# Patient Record
Sex: Male | Born: 1995 | Race: White | Hispanic: No | Marital: Single | Smoking: Never smoker
Health system: Southern US, Community
[De-identification: ages and names within clinical notes are randomized; demographics above are authoritative.]

---
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
## 2016-12-03 ENCOUNTER — Encounter: Payer: Self-pay | Admitting: Emergency Medicine

## 2016-12-03 ENCOUNTER — Emergency Department
Admission: EM | Admit: 2016-12-03 | Discharge: 2016-12-03 | Disposition: A | Discharge status: Home / Self Care | Payer: Worker's Compensation | Attending: Emergency Medicine | Admitting: Emergency Medicine

## 2016-12-03 DIAGNOSIS — S61411A Laceration without foreign body of right hand, initial encounter: Secondary | ICD-10-CM

## 2016-12-03 DIAGNOSIS — W268XXA Contact with other sharp object(s), not elsewhere classified, initial encounter: Secondary | ICD-10-CM | POA: Diagnosis not present

## 2016-12-03 DIAGNOSIS — S61011A Laceration without foreign body of right thumb without damage to nail, initial encounter: Secondary | ICD-10-CM | POA: Insufficient documentation

## 2016-12-03 DIAGNOSIS — Z23 Encounter for immunization: Secondary | ICD-10-CM | POA: Diagnosis not present

## 2016-12-03 DIAGNOSIS — Y990 Civilian activity done for income or pay: Secondary | ICD-10-CM | POA: Diagnosis not present

## 2016-12-03 DIAGNOSIS — Y9389 Activity, other specified: Secondary | ICD-10-CM | POA: Diagnosis not present

## 2016-12-03 DIAGNOSIS — Y929 Unspecified place or not applicable: Secondary | ICD-10-CM | POA: Diagnosis not present

## 2016-12-03 MED ORDER — LIDOCAINE HCL (PF) 1 % IJ SOLN
INTRAMUSCULAR | Status: AC
  Filled 2016-12-03: qty 5

## 2016-12-03 MED ORDER — TETANUS-DIPHTH-ACELL PERTUSSIS 5-2.5-18.5 LF-MCG/0.5 IM SUSP
INTRAMUSCULAR | Status: AC
Start: 2016-12-03 — End: 2016-12-03
  Administered 2016-12-03: 0.5 mL via INTRAMUSCULAR
  Filled 2016-12-03: qty 0.5

## 2016-12-03 MED ORDER — LIDOCAINE HCL (PF) 1 % IJ SOLN
5.0000 mL | Freq: Once | INTRAMUSCULAR | Status: DC
  Filled 2016-12-03: qty 5

## 2016-12-03 MED ORDER — TETANUS-DIPHTH-ACELL PERTUSSIS 5-2.5-18.5 LF-MCG/0.5 IM SUSP
0.5000 mL | Freq: Once | INTRAMUSCULAR | Status: AC
  Administered 2016-12-03: 0.5 mL via INTRAMUSCULAR

## 2016-12-03 MED ORDER — BACITRACIN ZINC 500 UNIT/GM EX OINT
1.0000 "application " | TOPICAL_OINTMENT | Freq: Two times a day (BID) | CUTANEOUS | Status: DC
  Administered 2016-12-03: 1 via TOPICAL
  Filled 2016-12-03: qty 0.9

## 2016-12-03 NOTE — ED Notes (Addendum)
Attempting to call Darvin Cornett [phone] no answer.  Per Timoteo AceWilliam Riley, supervisor, states for patient to have workers comp UDS completed. [facility] paperwork used to complete UDS by Waynetta SandyBeth, EDT.

## 2016-12-03 NOTE — ED Provider Notes (Signed)
[facility] Emergency Department Provider Note  ____________________________________________  Time seen: Approximately 9:42 PM  I have reviewed the triage vital signs and the nursing notes.   HISTORY  Chief Complaint Laceration   HPI Gerald Lopez is a 21 y.o. male who presents to the emergency department for evaluation of laceration to his right hand that was cut on a fire hose at work this evening. Tetanus status questionable.   History reviewed. No pertinent past medical history.  There are no active problems to display for this patient.   History reviewed. No pertinent surgical history.  Prior to Admission medications   Not on File    Allergies Patient has no known allergies.  No family history on file.  Social History Social History  Substance Use Topics  . Smoking status: Not on file  . Smokeless tobacco: Not on file  . Alcohol use Not on file    Review of Systems  Constitutional: Well appearing. Musculoskeletal: Positive for pain. Skin: Positive for laceration Neurological: Negative for focal weakness or numbness. ____________________________________________   PHYSICAL EXAM:  VITAL SIGNS: ED Triage Vitals  Enc Vitals Group     BP 12/03/16 2126 (!) 153/94     Pulse Rate 12/03/16 2126 70     Resp 12/03/16 2126 18     Temp 12/03/16 2126 98.7 F (37.1 C)     Temp Source 12/03/16 2126 Oral     SpO2 12/03/16 2126 99 %     Weight 12/03/16 2126 165 lb (74.8 kg)     Height 12/03/16 2126 5\' 11"  (1.803 m)     Head Circumference --      Peak Flow --      Pain Score 12/03/16 2135 1     Pain Loc --      Pain Edu? --      Excl. in GC? --      Constitutional: Alert and oriented. Well appearing and in no acute distress. Eyes: Conjunctivae are normal. EOMI. Nose: No congestion/rhinnorhea. Mouth/Throat: Mucous membranes are moist.   Neck: No stridor. Cardiovascular: Good peripheral circulation. Respiratory: Normal  respiratory effort.  No retractions. Musculoskeletal: FROM throughout. Neurologic:  Normal speech and language. No gross focal neurologic deficits are appreciated. Skin:  2cm laceration to the skin fold of base of the right thumb.  ____________________________________________   LABS (all labs ordered are listed, but only abnormal results are displayed)  Labs Reviewed - No data to display ____________________________________________  EKG   ____________________________________________  RADIOLOGY  Not indicated. ____________________________________________   PROCEDURES  Procedure(s) performed:  LACERATION REPAIR Performed by: Kem Boroughsari Essence Merle  Consent: Verbal consent obtained.  Consent given by: patient  Prepped and Draped in normal sterile fashion  Wound explored: no foreign bodies identified  Laceration Location: base of right thumb  Laceration Length: 2 cm  Anesthesia: local  Local anesthetic: lidocaine 1% without epinephrine  Anesthetic total: 3 ml  Irrigation method: syringe  Amount of cleaning: standard  Skin closure: 5-0 nylon  Number of sutures: 4  Technique: simple interrupted.  Patient tolerance: Patient tolerated the procedure well with no immediate complications.    ____________________________________________   INITIAL IMPRESSION / ASSESSMENT AND PLAN / ED COURSE     Pertinent labs & imaging results that were available during my care of the patient were reviewed by me and considered in my medical decision making (see chart for details).  21 year old male presenting to the emergency department for laceration repair. He was given wound  care instructions and advised to follow up with the PCP or return to the ER for symptoms of concern. He was advised to have the sutures removed in 10 days. ____________________________________________   FINAL CLINICAL IMPRESSION(S) / ED DIAGNOSES  Final diagnoses:  Laceration of right hand without  foreign body, initial encounter    There are no discharge medications for this patient.   Note:  This document was prepared using Dragon voice recognition software and may include unintentional dictation errors.    Chinita Pester, FNP 12/04/16 1603    Myrna Blazer, MD 12/06/16 [phone]

## 2016-12-03 NOTE — ED Triage Notes (Signed)
Pt presents to ED with c/o laceration to RIGHT hand from fireman hose. Hand bandaged.

## 2016-12-03 NOTE — ED Notes (Signed)
Lac to right hand, wrapped, bleeding controlled at this time.

## 2016-12-03 NOTE — ED Notes (Signed)
Pt. Verbalizes understanding of d/c instructions and follow-up. VS stable and pain controlled per pt.  Pt. In NAD at time of d/c and denies further concerns regarding this visit. Pt. Stable at the time of departure from the unit, departing unit by the safest and most appropriate manner per that pt condition and limitations. Pt advised to return to the ED at any time for emergent concerns, or for new/worsening symptoms.   

## 2016-12-03 NOTE — Discharge Instructions (Signed)
Do not get the sutured area wet for 24 hours. After 24 hours, shower/bathe as usual and pat the area dry. Change the bandage 2 times per day and apply antibiotic ointment. Leave open to air when at no risk of getting the area dirty, but cover at night before bed.   

## 2016-12-13 ENCOUNTER — Emergency Department
Admission: EM | Admit: 2016-12-13 | Discharge: 2016-12-13 | Disposition: A | Discharge status: Home / Self Care | Payer: Worker's Compensation | Attending: Emergency Medicine | Admitting: Emergency Medicine

## 2016-12-13 ENCOUNTER — Encounter: Payer: Self-pay | Admitting: Emergency Medicine

## 2016-12-13 DIAGNOSIS — Z4802 Encounter for removal of sutures: Secondary | ICD-10-CM

## 2016-12-13 NOTE — ED Triage Notes (Signed)
Patient presents to the ED for suture removal.  Denies issues.

## 2016-12-13 NOTE — ED Provider Notes (Signed)
[facility] Emergency Department Provider Note   ____________________________________________   First MD Initiated Contact with Patient 12/13/16 1344     (approximate)  I have reviewed the triage vital signs and the nursing notes.   HISTORY  Chief Complaint Suture / Staple Removal    HPI Gerald Lopez is a 21 y.o. male patient here for suture removal from left thenar fossa. Sutures placed 10 days ago by this facility. Patient denies any pain or signs and symptoms of infection.  History reviewed. No pertinent past medical history.  There are no active problems to display for this patient.   History reviewed. No pertinent surgical history.  Prior to Admission medications   Not on File    Allergies Patient has no known allergies.  No family history on file.  Social History Social History  Substance Use Topics  . Smoking status: Never Smoker  . Smokeless tobacco: Never Used  . Alcohol use No    Review of Systems Constitutional: No fever/chills Eyes: No visual changes. ENT: No sore throat. Cardiovascular: Denies chest pain. Respiratory: Denies shortness of breath. Gastrointestinal: No abdominal pain.  No nausea, no vomiting.  No diarrhea.  No constipation. Genitourinary: Negative for dysuria. Musculoskeletal: Negative for back pain. Skin: Negative for rash. Laceration left hand Neurological: Negative for headaches, focal weakness or numbness.    ____________________________________________   PHYSICAL EXAM:  VITAL SIGNS: ED Triage Vitals  Enc Vitals Group     BP 12/13/16 1254 (!) 159/84     Pulse Rate 12/13/16 1254 93     Resp 12/13/16 1254 18     Temp 12/13/16 1254 98.5 F (36.9 C)     Temp Source 12/13/16 1254 Oral     SpO2 12/13/16 1254 100 %     Weight 12/13/16 1251 160 lb (72.6 kg)     Height 12/13/16 1251 5\' 11"  (1.803 m)     Head Circumference --      Peak Flow --      Pain Score 12/13/16 1251 0     Pain Loc --       Pain Edu? --      Excl. in GC? --     Constitutional: Alert and oriented. Well appearing and in no acute distress. Eyes: Conjunctivae are normal. PERRL. EOMI. Head: Atraumatic. Nose: No congestion/rhinnorhea. Mouth/Throat: Mucous membranes are moist.  Oropharynx non-erythematous. Neck: No stridor.  No cervical spine tenderness to palpation. Hematological/Lymphatic/Immunilogical: No cervical lymphadenopathy. Cardiovascular: Normal rate, regular rhythm. Grossly normal heart sounds.  Good peripheral circulation. Respiratory: Normal respiratory effort.  No retractions. Lungs CTAB. Gastrointestinal: Soft and nontender. No distention. No abdominal bruits. No CVA tenderness. Musculoskeletal: No lower extremity tenderness nor edema.  No joint effusions. Neurologic:  Normal speech and language. No gross focal neurologic deficits are appreciated. No gait instability. Skin:  Skin is warm, dry and intact. No rash noted. Psychiatric: Mood and affect are normal. Speech and behavior are normal.  ____________________________________________   LABS (all labs ordered are listed, but only abnormal results are displayed)  Labs Reviewed - No data to display ____________________________________________  EKG   ____________________________________________  RADIOLOGY   ____________________________________________   PROCEDURES  Procedure(s) performed: None  Procedures  Critical Care performed: No  ____________________________________________   INITIAL IMPRESSION / ASSESSMENT AND PLAN / ED COURSE  Pertinent labs & imaging results that were available during my care of the patient were reviewed by me and considered in my medical decision making (see chart for details).  Removal of 4 simple sutures of left  Thenar fossa. There he was bandaged and patient given discharge care instructions. Advised to follow-up condition worsens.     ____________________________________________   FINAL CLINICAL IMPRESSION(S) / ED DIAGNOSES  Final diagnoses:  Encounter for removal of sutures      NEW MEDICATIONS STARTED DURING THIS VISIT:  New Prescriptions   No medications on file     Note:  This document was prepared using Dragon voice recognition software and may include unintentional dictation errors.    Joni Reining, PA-C 12/13/16 1350    Charlynne Pander, MD 12/13/16 1539

## 2019-02-09 DIAGNOSIS — R221 Localized swelling, mass and lump, neck: Secondary | ICD-10-CM | POA: Diagnosis not present

## 2019-02-09 DIAGNOSIS — E663 Overweight: Secondary | ICD-10-CM | POA: Diagnosis not present

## 2019-03-09 DIAGNOSIS — R221 Localized swelling, mass and lump, neck: Secondary | ICD-10-CM | POA: Diagnosis not present

## 2019-07-02 DIAGNOSIS — M25539 Pain in unspecified wrist: Secondary | ICD-10-CM | POA: Diagnosis not present

## 2019-08-21 DIAGNOSIS — Z20828 Contact with and (suspected) exposure to other viral communicable diseases: Secondary | ICD-10-CM | POA: Diagnosis not present

## 2019-11-26 DIAGNOSIS — F458 Other somatoform disorders: Secondary | ICD-10-CM | POA: Diagnosis not present

## 2019-11-26 DIAGNOSIS — K219 Gastro-esophageal reflux disease without esophagitis: Secondary | ICD-10-CM | POA: Diagnosis not present

## 2019-11-27 DIAGNOSIS — Z136 Encounter for screening for cardiovascular disorders: Secondary | ICD-10-CM | POA: Diagnosis not present

## 2019-12-05 DIAGNOSIS — Z139 Encounter for screening, unspecified: Secondary | ICD-10-CM | POA: Diagnosis not present

## 2020-01-07 DIAGNOSIS — J301 Allergic rhinitis due to pollen: Secondary | ICD-10-CM | POA: Diagnosis not present

## 2020-01-07 DIAGNOSIS — K219 Gastro-esophageal reflux disease without esophagitis: Secondary | ICD-10-CM | POA: Diagnosis not present

## 2020-03-15 DIAGNOSIS — Z20822 Contact with and (suspected) exposure to covid-19: Secondary | ICD-10-CM | POA: Diagnosis not present

## 2020-03-15 DIAGNOSIS — B349 Viral infection, unspecified: Secondary | ICD-10-CM | POA: Diagnosis not present

## 2020-06-19 ENCOUNTER — Ambulatory Visit
Admission: RE | Admit: 2020-06-19 | Discharge: 2020-06-19 | Disposition: A | Discharge status: Home / Self Care | Payer: BC Managed Care – PPO | Source: Ambulatory Visit | Attending: Internal Medicine | Admitting: Internal Medicine

## 2020-06-19 ENCOUNTER — Other Ambulatory Visit: Payer: Self-pay

## 2020-06-19 VITALS — BP 147/79 | HR 58 | Temp 97.9°F | Resp 16 | Ht 71.0 in | Wt 190.0 lb

## 2020-06-19 DIAGNOSIS — R030 Elevated blood-pressure reading, without diagnosis of hypertension: Secondary | ICD-10-CM

## 2020-06-19 NOTE — ED Triage Notes (Signed)
Pt sts he has been checking his BP at home and it has ranged from 130-140. Pt feels that this BP is high for him. No new stressors.

## 2020-06-21 NOTE — ED Provider Notes (Signed)
[facility]    CSN: [id number] Arrival date & time: 06/19/20  1348      History   Chief Complaint Chief Complaint  Patient presents with  . Appointment  . irregular blood pressure    HPI Gerald Lopez is a 24 y.o. male comes to the urgent care with concern about elevated blood pressure.  Patient says that he has been measuring his blood pressure at home and has noticed that his blood pressure has been irregular.  It is sometimes high and at other times read normal.  He says the highs are in the low 140s for systolic.  No headaches, chest pain, abdominal pain.Marland Kitchen   HPI  History reviewed. No pertinent past medical history.  There are no problems to display for this patient.   History reviewed. No pertinent surgical history.     Home Medications    Prior to Admission medications   Medication Sig Start Date End Date Taking? Authorizing Provider  omeprazole (PRILOSEC) 40 MG capsule SMARTSIG:1 Capsule(s) By Mouth Every Evening 02/05/20   [provider]    Family History No family history on file.  Social History Social History   Tobacco Use  . Smoking status: Never Smoker  . Smokeless tobacco: Never Used  Substance Use Topics  . Alcohol use: No  . Drug use: Not on file     Allergies   Patient has no known allergies.   Review of Systems Review of Systems  Constitutional: Negative.      Physical Exam Triage Vital Signs ED Triage Vitals [06/19/20 1426]  Enc Vitals Group     BP (!) 147/79     Pulse Rate (!) 58     Resp 16     Temp 97.9 F (36.6 C)     Temp Source Oral     SpO2 100 %     Weight 190 lb (86.2 kg)     Height 5\' 11"  (1.803 m)     Head Circumference      Peak Flow      Pain Score 0     Pain Loc      Pain Edu?      Excl. in GC?    No data found.  Updated Vital Signs BP (!) 147/79   Pulse (!) 58   Temp 97.9 F (36.6 C) (Oral)   Resp 16   Ht 5\' 11"  (1.803 m)   Wt 86.2 kg   SpO2 100%   BMI 26.50 kg/m    Visual Acuity Right Eye Distance:   Left Eye Distance:   Bilateral Distance:    Right Eye Near:   Left Eye Near:    Bilateral Near:     Physical Exam Vitals and nursing note reviewed.  Constitutional:      Appearance: Normal appearance.  Cardiovascular:     Rate and Rhythm: Normal rate and regular rhythm.     Pulses: Normal pulses.     Heart sounds: Normal heart sounds.  Pulmonary:     Effort: Pulmonary effort is normal.     Breath sounds: Normal breath sounds.  Abdominal:     General: Bowel sounds are normal.     Palpations: Abdomen is soft.  Neurological:     General: No focal deficit present.     Mental Status: He is alert and oriented to person, place, and time.      UC Treatments / Results  Labs (all labs ordered are listed, but only abnormal results  are displayed) Labs Reviewed - No data to display  EKG   Radiology No results found.  Procedures Procedures (including critical care time)  Medications Ordered in UC Medications - No data to display  Initial Impression / Assessment and Plan / UC Course  I have reviewed the triage vital signs and the nursing notes.  Pertinent labs & imaging results that were available during my care of the patient were reviewed by me and considered in my medical decision making (see chart for details).     1.  Elevated blood pressure without the diagnosis of hypertension: Patient was advised to increase his physical activity Decrease salt intake Monitor blood pressure on a regular basis Establish primary care Return precautions given. Final Clinical Impressions(s) / UC Diagnoses   Final diagnoses:  Elevated BP without diagnosis of hypertension   Discharge Instructions   None    ED Prescriptions    None     PDMP not reviewed this encounter.   Merrilee Jansky, MD 06/21/20 [phone]
